# Patient Record
Sex: Male | Born: 1985 | Race: White | Hispanic: No | Marital: Married | Smoking: Never smoker
Health system: Southern US, Community
[De-identification: ages and names within clinical notes are randomized; demographics above are authoritative.]

## PROBLEM LIST (undated history)

## (undated) DIAGNOSIS — E119 Type 2 diabetes mellitus without complications: Secondary | ICD-10-CM

## (undated) DIAGNOSIS — I10 Essential (primary) hypertension: Secondary | ICD-10-CM

## (undated) DIAGNOSIS — E785 Hyperlipidemia, unspecified: Secondary | ICD-10-CM

## (undated) DIAGNOSIS — G4730 Sleep apnea, unspecified: Secondary | ICD-10-CM

## (undated) DIAGNOSIS — G43909 Migraine, unspecified, not intractable, without status migrainosus: Secondary | ICD-10-CM

## (undated) HISTORY — PX: BACK SURGERY: SHX140

---

## 2017-03-22 ENCOUNTER — Emergency Department (HOSPITAL_COMMUNITY): Payer: Worker's Compensation

## 2017-03-22 ENCOUNTER — Emergency Department (HOSPITAL_COMMUNITY)
Admission: EM | Admit: 2017-03-22 | Discharge: 2017-03-22 | Disposition: A | Discharge status: Home / Self Care | Payer: Worker's Compensation | Attending: Emergency Medicine | Admitting: Emergency Medicine

## 2017-03-22 ENCOUNTER — Encounter (HOSPITAL_COMMUNITY): Payer: Self-pay

## 2017-03-22 DIAGNOSIS — S9031XA Contusion of right foot, initial encounter: Secondary | ICD-10-CM | POA: Diagnosis not present

## 2017-03-22 DIAGNOSIS — Y990 Civilian activity done for income or pay: Secondary | ICD-10-CM | POA: Diagnosis not present

## 2017-03-22 DIAGNOSIS — Y929 Unspecified place or not applicable: Secondary | ICD-10-CM | POA: Diagnosis not present

## 2017-03-22 DIAGNOSIS — W228XXA Striking against or struck by other objects, initial encounter: Secondary | ICD-10-CM | POA: Insufficient documentation

## 2017-03-22 DIAGNOSIS — Y939 Activity, unspecified: Secondary | ICD-10-CM | POA: Diagnosis not present

## 2017-03-22 DIAGNOSIS — S99921A Unspecified injury of right foot, initial encounter: Secondary | ICD-10-CM | POA: Diagnosis present

## 2017-03-22 MED ORDER — IBUPROFEN 800 MG PO TABS
800.0000 mg | ORAL_TABLET | Freq: Once | ORAL | Status: AC
  Administered 2017-03-22: 800 mg via ORAL
  Filled 2017-03-22: qty 1

## 2017-03-22 NOTE — ED Provider Notes (Signed)
[facility]-EMERGENCY DEPT Provider Note   CSN: [id number] Arrival date & time: 03/22/17  1426  By signing my name below, I, Deland Pretty, attest that this documentation has been prepared under the direction and in the presence of [address] [place] Electronically Signed: Deland Pretty, ED Scribe. 03/22/17. 4:39 PM.  History   Chief Complaint Chief Complaint  Patient presents with  . Foot Problem   The history is provided by the patient and medical records. No language interpreter was used.  Foot Injury   The incident occurred 3 to 5 hours ago. The incident occurred at work. The injury mechanism was a direct blow. The pain is present in the right foot. The pain is at a severity of 5/10. The pain is moderate. The pain has been constant since onset. Associated symptoms include tingling. Pertinent negatives include no numbness, no loss of motion, no muscle weakness and no loss of sensation. He reports no foreign bodies present. The symptoms are aggravated by bearing weight and activity. He has tried nothing for the symptoms. The treatment provided no relief.    HPI Comments: Ricky Webb is a 31 y.o. male who presents to the Emergency Department complaining of R foot injury sustained at ~12:45pm (~4hrs prior to eval) when a heavy metal "bottle jack" fell out of a box and landed on his R foot at work. He describes his pain as 5/10 sharp constant nonradiating right dorsal foot pain that worsens with pressure and ambulation, and with no tx tried PTA. He reports associating tingling in the area where the metal object fell. The pt denies swelling, bruising, abrasions/lacerations, numbness, focal weakness, or any other complaints/additional injuries sustained at this time.    No past medical history on file.  There are no active problems to display for this patient.   No past surgical history on file.     Home Medications    Prior to Admission medications   Not on File    Family  History No family history on file.  Social History Social History  Substance Use Topics  . Smoking status: Never Smoker  . Smokeless tobacco: Never Used  . Alcohol use No     Allergies   Patient has no allergy information on record.   Review of Systems Review of Systems  Musculoskeletal: Positive for arthralgias. Negative for joint swelling and myalgias.  Skin: Negative for color change and wound.  Allergic/Immunologic: Negative for immunocompromised state.  Neurological: Positive for tingling. Negative for weakness and numbness.       +tingling R foot  Psychiatric/Behavioral: Negative for confusion.     Physical Exam Updated Vital Signs BP 131/82 (BP Location: Left Arm)   Pulse 97   Temp 97.9 F (36.6 C) (Oral)   Resp 18   Ht 5\' 11"  (1.803 m)   Wt 260 lb (117.9 kg)   SpO2 99%   BMI 36.26 kg/m   Physical Exam  Constitutional: He is oriented to person, place, and time. Vital signs are normal. He appears well-developed and well-nourished.  Non-toxic appearance. No distress.  Afebrile, nontoxic, NAD  HENT:  Head: Normocephalic and atraumatic.  Mouth/Throat: Mucous membranes are normal.  Eyes: Conjunctivae and EOM are normal. Right eye exhibits no discharge. Left eye exhibits no discharge.  Neck: Normal range of motion. Neck supple.  Cardiovascular: Normal rate and intact distal pulses.   Pulmonary/Chest: Effort normal. No respiratory distress.  Abdominal: Normal appearance. He exhibits no distension.  Musculoskeletal: Normal range of motion.  Right foot: There is tenderness, bony tenderness and swelling. There is normal range of motion, normal capillary refill, no crepitus, no deformity and no laceration.  Right foot and ankle with FROM intact, with mild swelling and bruising to the dorsal aspect of the 2nd-4th metatarsals, with mild TTP over this area, skin intact with no abrasions, no crepitus or deformity, Strength and sensation grossly intact, distal pulses  intact, compartments soft.  Neurological: He is alert and oriented to person, place, and time. He has normal strength. No sensory deficit.  Skin: Skin is warm, dry and intact. No rash noted.  Psychiatric: He has a normal mood and affect.  Nursing note and vitals reviewed.    ED Treatments / Results   DIAGNOSTIC STUDIES: Oxygen Saturation is 99% on RA, normal by my interpretation.   COORDINATION OF CARE: 4:25 PM-Discussed next steps with pt. Pt verbalized understanding and is agreeable with the plan.    Labs (all labs ordered are listed, but only abnormal results are displayed) Labs Reviewed - No data to display  EKG  EKG Interpretation None       Radiology Dg Foot Complete Right  Result Date: 03/22/2017 CLINICAL DATA:  Right foot injury today, pain to dorsum of right foot. EXAM: RIGHT FOOT COMPLETE - 3+ VIEW COMPARISON:  None. FINDINGS: There is no evidence of fracture or dislocation. There is no evidence of arthropathy or other focal bone abnormality. Soft tissues are unremarkable. IMPRESSION: Negative. Electronically Signed   By: Bary RichardStan  Maynard M.D.   On: 03/22/2017 16:59    Procedures Procedures (including critical care time)  Medications Ordered in ED Medications  ibuprofen (ADVIL,MOTRIN) tablet 800 mg (800 mg Oral Given 03/22/17 1707)     Initial Impression / Assessment and Plan / ED Course  I have reviewed the triage vital signs and the nursing notes.  Pertinent labs & imaging results that were available during my care of the patient were reviewed by me and considered in my medical decision making (see chart for details).     31 y.o. male here with R foot injury at work, heavy metal object fell on dorsal aspect of foot. Mild swelling, bruising, and tenderness to dorsal aspect particularly over 2nd-4th metatarsals. Wiggles all digits, NVI with soft compartments. Will obtain xray and give ibuprofen, then recheck shortly.   5:28 PM Xray negative, however will  still treat conservatively as a possible occult fx since foot xrays can sometimes be unreliable; doubt need for further emergent work up, but will just treat conservatively. Will place in CAM walker and give crutches for comfort. Advised RICE, tylenol/motrin for pain, and f/up with podiatry in 5-7 days for recheck of symptoms and ongoing management of his injury. I explained the diagnosis and have given explicit precautions to return to the ER including for any other new or worsening symptoms. The patient understands and accepts the medical plan as it's been dictated and I have answered their questions. Discharge instructions concerning home care and prescriptions have been given. The patient is STABLE and is discharged to home in good condition.   I personally performed the services described in this documentation, which was scribed in my presence. The recorded information has been reviewed and is accurate.    Final Clinical Impressions(s) / ED Diagnoses   Final diagnoses:  Contusion of right foot, initial encounter  Injury of right foot, initial encounter    New Prescriptions New Prescriptions   No medications on [address]file     Marlaya Turck, BryanMercedes, [place] JerseyPA-C  03/22/17 1733    Doug Sou, MD 03/23/17 2536

## 2017-03-22 NOTE — Discharge Instructions (Signed)
Wear CAM walker until you see the orthopedist. Use crutches as needed for comfort. Ice and elevate foot  throughout the day, using ice pack for no more than 20 minutes every hour.  Alternate between tylenol and motrin as needed for pain relief. Call the podiatrist follow up today or tomorrow to schedule followup appointment for recheck of ongoing foot pain in 5-7 days for recheck of symptoms and ongoing management of your injury. Return to the ER for changes or worsening symptoms.

## 2017-03-22 NOTE — ED Notes (Signed)
Bed: [facility] Expected date:  Expected time:  Means of arrival:  Comments: 

## 2017-03-22 NOTE — ED Triage Notes (Signed)
He states a bottle jack fell onto his right foot at work today.

## 2017-03-28 ENCOUNTER — Encounter: Payer: Self-pay | Admitting: Podiatry

## 2017-03-28 ENCOUNTER — Ambulatory Visit (INDEPENDENT_AMBULATORY_CARE_PROVIDER_SITE_OTHER): Payer: Worker's Compensation | Admitting: Podiatry

## 2017-03-28 DIAGNOSIS — S9031XA Contusion of right foot, initial encounter: Secondary | ICD-10-CM

## 2017-03-28 MED ORDER — DICLOFENAC SODIUM 75 MG PO TBEC: 75.0000 mg | DELAYED_RELEASE_TABLET | Freq: Two times a day (BID) | ORAL | 1 refills | Status: DC

## 2017-03-29 NOTE — Progress Notes (Signed)
   HPI: Patient is a 31 year old male presenting today with a complaint of sharp, shooting pain and tightness to the dorsum of the right foot that began 6 days ago. He reports dropping a 50 pound piece of steel on the foot. He was seen in the ED and given a cam boot. He reports significant pain when attempting to walk without the boot. Walking and applying pressure to the foot increases the pain. He has been wearing the boot and using crutches as well as taking Tylenol and Motrin with no significant relief of the pain. He is here for further evaluation and treatment.   Physical Exam: General: The patient is alert and oriented x3 in no acute distress.  Dermatology: Skin is warm, dry and supple bilateral lower extremities. Negative for open lesions or macerations.  Vascular: Palpable pedal pulses bilaterally. No edema or erythema noted. Capillary refill within normal limits.  Neurological: Epicritic and protective threshold grossly intact bilaterally.   Musculoskeletal Exam: Moderate edema with pain on palpation throughout dorsum of right foot. Range of motion within normal limits to all pedal and ankle joints bilateral. Muscle strength 5/5 in all groups bilateral.    Assessment: 1. Right foot contusion secondary to trauma   Plan of Care:  1. Patient was evaluated. X-rays from ED reviewed. 2. Continue wearing cam boot. 3. Ace wraps dispensed. 4. Note for work provided. 5. Prescription for diclofenac 75 mg given to patient. 6. Return to clinic in 3 weeks.  Patient works at [employer].   Felecia ShellingBrent M. Evans, DPM [facility]  Dr. Felecia ShellingBrent M. Evans, DPM    [address]                                        [place], [place] [address]                Office [phone]  Fax [phone]

## 2017-04-18 ENCOUNTER — Encounter: Payer: Self-pay | Admitting: Podiatry

## 2017-04-18 ENCOUNTER — Ambulatory Visit (INDEPENDENT_AMBULATORY_CARE_PROVIDER_SITE_OTHER): Payer: Worker's Compensation | Admitting: Podiatry

## 2017-04-18 DIAGNOSIS — S9031XD Contusion of right foot, subsequent encounter: Secondary | ICD-10-CM

## 2017-04-18 MED ORDER — DICLOFENAC SODIUM 75 MG PO TBEC: 75.0000 mg | DELAYED_RELEASE_TABLET | Freq: Two times a day (BID) | ORAL | 1 refills | Status: DC

## 2017-04-22 NOTE — Progress Notes (Signed)
   HPI: Patient is a 31 year old male presenting today for follow-up evaluation of right foot pain secondary to trauma. He states the pain has improved but he is still experiencing tenderness to the plantar aspect of the foot. He reports associated improving swelling. He has been taking diclofenac with some relief of pain. He denies any new complaints at this time.    Physical Exam: General: The patient is alert and oriented x3 in no acute distress.  Dermatology: Skin is warm, dry and supple bilateral lower extremities. Negative for open lesions or macerations.  Vascular: Palpable pedal pulses bilaterally. No edema or erythema noted. Capillary refill within normal limits.  Neurological: Epicritic and protective threshold grossly intact bilaterally.   Musculoskeletal Exam: Minimal pain on palpation to right midfoot. Range of motion within normal limits to all pedal and ankle joints bilateral. Muscle strength 5/5 in all groups bilateral.    Assessment: 1. Right foot contusion secondary to trauma   Plan of Care:  1. Patient was evaluated.  2. Refill prescription for diclofenac 75 mg #60 given to patient. 3. Compression anklet dispensed. 4. Return to clinic when necessary.  Patient works at [employer].   Felecia ShellingBrent M. Xareni Kelch, DPM [facility]  Dr. Felecia ShellingBrent M. Apollo Timothy, DPM    [address]                                        [place], [place] [address]                Office [phone]  Fax [phone]

## 2017-07-06 ENCOUNTER — Emergency Department
Admission: EM | Admit: 2017-07-06 | Discharge: 2017-07-06 | Disposition: A | Discharge status: Home / Self Care | Payer: Self-pay | Attending: Emergency Medicine | Admitting: Emergency Medicine

## 2017-07-06 ENCOUNTER — Emergency Department: Payer: Self-pay

## 2017-07-06 ENCOUNTER — Encounter: Payer: Self-pay | Admitting: Emergency Medicine

## 2017-07-06 DIAGNOSIS — I10 Essential (primary) hypertension: Secondary | ICD-10-CM | POA: Insufficient documentation

## 2017-07-06 DIAGNOSIS — Z79899 Other long term (current) drug therapy: Secondary | ICD-10-CM | POA: Insufficient documentation

## 2017-07-06 DIAGNOSIS — J209 Acute bronchitis, unspecified: Secondary | ICD-10-CM | POA: Insufficient documentation

## 2017-07-06 DIAGNOSIS — J029 Acute pharyngitis, unspecified: Secondary | ICD-10-CM | POA: Insufficient documentation

## 2017-07-06 HISTORY — DX: Essential (primary) hypertension: I10

## 2017-07-06 MED ORDER — BENZONATATE 100 MG PO CAPS: 200.0000 mg | ORAL_CAPSULE | Freq: Three times a day (TID) | ORAL | 0 refills | Status: DC | PRN

## 2017-07-06 MED ORDER — SULFAMETHOXAZOLE-TRIMETHOPRIM 800-160 MG PO TABS: 1.0000 | ORAL_TABLET | Freq: Two times a day (BID) | ORAL | 0 refills | Status: DC

## 2017-07-06 MED ORDER — IPRATROPIUM-ALBUTEROL 0.5-2.5 (3) MG/3ML IN SOLN
3.0000 mL | Freq: Once | RESPIRATORY_TRACT | Status: AC
  Administered 2017-07-06: 3 mL via RESPIRATORY_TRACT
  Filled 2017-07-06: qty 3

## 2017-07-06 MED ORDER — PREDNISONE 10 MG PO TABS: ORAL_TABLET | ORAL | 0 refills | Status: DC

## 2017-07-06 NOTE — ED Triage Notes (Signed)
Pt arrived via pov with reports of runny nose, sore throat for a week, cough that is productive and hoarse voice.  Pt has been taking nyquil and benadryl OTC as well as mucinex.   Pt denies any fevers.

## 2017-07-06 NOTE — ED Provider Notes (Signed)
[facility] Emergency Department Provider Note   ____________________________________________   First MD Initiated Contact with Patient 07/06/17 1224     (approximate)  I have reviewed the triage vital signs and the nursing notes.   HISTORY  Chief Complaint Cough; Nasal Congestion; and Sore Throat   HPI Ricky Webb is a 31 y.o. male is here complaining of rhinorrhea sore throat and cough for one week. Patient states that cough has been productive and he is also hoarse. He has been taking over-the-counter medications including NyQuil, Benadryl, Mucinex without any relief. Patient denies any fever or chills. He continues to eat and drink normally. Patient states that he occasionally vaps but is not a smoker. He rates his pain as 6/10.   Past Medical History:  Diagnosis Date  . Hypertension     There are no active problems to display for this patient.   Past Surgical History:  Procedure Laterality Date  . BACK SURGERY      Prior to Admission medications   Medication Sig Start Date End Date Taking? Authorizing Provider  benzonatate (TESSALON PERLES) 100 MG capsule Take 2 capsules (200 mg total) by mouth 3 (three) times daily as needed. 07/06/17 07/06/18  Tommi RumpsSummers, Francisco Ostrovsky L, PA-C  diclofenac (VOLTAREN) 75 MG EC tablet Take 1 tablet (75 mg total) by mouth 2 (two) times daily. 04/18/17   Felecia ShellingEvans, Brent M, DPM  predniSONE (DELTASONE) 10 MG tablet Take 6 tablets  today, on day 2 take 5 tablets, day 3 take 4 tablets, day 4 take 3 tablets, day 5 take  2 tablets and 1 tablet the last day 07/06/17   Tommi RumpsSummers, Carmello Cabiness L, PA-C  sulfamethoxazole-trimethoprim (BACTRIM DS,SEPTRA DS) 800-160 MG tablet Take 1 tablet by mouth 2 (two) times daily. 07/06/17   Tommi RumpsSummers, Valborg Friar L, PA-C    Allergies Penicillins  No family history on file.  Social History Social History  Substance Use Topics  . Smoking status: Never Smoker  . Smokeless tobacco: Never Used  . Alcohol use No     Review of Systems Constitutional: No fever/chills Eyes: No visual changes. ENT: Positive sore throat and hoarseness. Positive rhinorrhea. Cardiovascular: Denies chest pain. Respiratory: Denies shortness of breath. Positive productive cough. Gastrointestinal:  No nausea, no vomiting.  No diarrhea.  Skin: Negative for rash. Neurological: Negative for headaches, focal weakness or numbness. ____________________________________________   PHYSICAL EXAM:  VITAL SIGNS: ED Triage Vitals  Enc Vitals Group     BP 07/06/17 1201 130/84     Pulse Rate 07/06/17 1201 93     Resp --      Temp 07/06/17 1201 98 F (36.7 C)     Temp Source 07/06/17 1201 Oral     SpO2 07/06/17 1201 94 %     Weight 07/06/17 1201 250 lb (113.4 kg)     Height 07/06/17 1201 5\' 11"  (1.803 m)     Head Circumference --      Peak Flow --      Pain Score 07/06/17 1207 6     Pain Loc --      Pain Edu? --      Excl. in GC? --    Constitutional: Alert and oriented. Well appearing and in no acute distress. Eyes: Conjunctivae are normal.  Head: Atraumatic. Nose: Moderate congestion/rhinnorhea. Mouth/Throat: Mucous membranes are moist.  Oropharynx non-erythematous. Moderate posterior drainage noted. Neck: No stridor.   Hematological/Lymphatic/Immunilogical: No cervical lymphadenopathy. Cardiovascular: Normal rate, regular rhythm. Grossly normal heart sounds.  Good peripheral circulation.  Respiratory: Normal respiratory effort.  No retractions. Lungs occasional expiratory wheeze heard. Clears with cough. Musculoskeletal: No lower extremity tenderness nor edema.  No joint effusions. Neurologic:  Normal speech and language. No gross focal neurologic deficits are appreciated.  Skin:  Skin is warm, dry and intact. No rash noted. Psychiatric: Mood and affect are normal. Speech and behavior are normal.  ____________________________________________   LABS (all labs ordered are listed, but only abnormal results are  displayed)  Labs Reviewed - No data to display  RADIOLOGY  Dg Chest 2 View  Result Date: 07/06/2017 CLINICAL DATA:  Runny nose, sore throat for a week, worsening cough that is productive and hoarse voice. Pt denies any fevers. Hx - HTN, occasionally vapes. EXAM: CHEST  2 VIEW COMPARISON:  None. FINDINGS: Heart size is normal. Lungs are clear. No pleural effusion or pneumothorax seen. Old healed rib fractures on the right. No acute or suspicious osseous finding. IMPRESSION: No active cardiopulmonary disease. No evidence of pneumonia or pulmonary edema. Old healed rib fractures on the right. Electronically Signed   By: Bary Richard M.D.   On: 07/06/2017 13:18    ____________________________________________   PROCEDURES  Procedure(s) performed: None  Procedures  Critical Care performed: No  ____________________________________________   INITIAL IMPRESSION / ASSESSMENT AND PLAN / ED COURSE  Pertinent labs & imaging results that were available during my care of the patient were reviewed by me and considered in my medical decision making (see chart for details).  ----------------------------------------- 12:59 PM on 07/06/2017 ----------------------------------------- Patient had DuoNeb treatment and continues to wheeze faintly bilaterally. Continued cough but nonproductive.   Patient's chest x-ray was reassuring that there is no pneumonia present. Patient was discharged with a prescription for Tessalon Perles to every 8 hours as needed for cough, Bactrim DS twice a day for 10 days and a tapering dose of prednisone starting at 60 mg. He is follow-up with [facility] clinic if any continued problems.   ___________________________________________   FINAL CLINICAL IMPRESSION(S) / ED DIAGNOSES  Final diagnoses:  Acute bronchitis, unspecified organism      NEW MEDICATIONS STARTED DURING THIS VISIT:  Discharge Medication List as of 07/06/2017  1:28 PM    START taking these  medications   Details  benzonatate (TESSALON PERLES) 100 MG capsule Take 2 capsules (200 mg total) by mouth 3 (three) times daily as needed., Starting Sun 07/06/2017, Until Mon 07/06/2018, Print    predniSONE (DELTASONE) 10 MG tablet Take 6 tablets  today, on day 2 take 5 tablets, day 3 take 4 tablets, day 4 take 3 tablets, day 5 take  2 tablets and 1 tablet the last day, Print    sulfamethoxazole-trimethoprim (BACTRIM DS,SEPTRA DS) 800-160 MG tablet Take 1 tablet by mouth 2 (two) times daily., Starting Sun 07/06/2017, Print         Note:  This document was prepared using Dragon voice recognition software and may include unintentional dictation errors.    Tommi Rumps, PA-C 07/06/17 1337    Dionne Bucy, MD 07/06/17 1530

## 2017-07-06 NOTE — Discharge Instructions (Signed)
Follow-up with [facility] if any continued problems. Begin taking prednisone as directed begin with 6 tablets today and tapering down. Tessalon Perles as needed for cough and begin Bactrim DS which is the antibiotic one tablet twice a day for 10 days. You may still take Tylenol or ibuprofen as needed for fever or body aches.

## 2018-01-05 ENCOUNTER — Other Ambulatory Visit: Payer: Self-pay

## 2018-01-05 ENCOUNTER — Emergency Department
Admission: EM | Admit: 2018-01-05 | Discharge: 2018-01-05 | Disposition: A | Discharge status: Home / Self Care | Payer: Medicaid Other | Attending: Emergency Medicine | Admitting: Emergency Medicine

## 2018-01-05 DIAGNOSIS — B9789 Other viral agents as the cause of diseases classified elsewhere: Secondary | ICD-10-CM

## 2018-01-05 DIAGNOSIS — F17290 Nicotine dependence, other tobacco product, uncomplicated: Secondary | ICD-10-CM | POA: Diagnosis not present

## 2018-01-05 DIAGNOSIS — J069 Acute upper respiratory infection, unspecified: Secondary | ICD-10-CM

## 2018-01-05 DIAGNOSIS — I10 Essential (primary) hypertension: Secondary | ICD-10-CM | POA: Diagnosis not present

## 2018-01-05 DIAGNOSIS — R05 Cough: Secondary | ICD-10-CM | POA: Diagnosis present

## 2018-01-05 MED ORDER — GUAIFENESIN-CODEINE 100-10 MG/5ML PO SOLN: 5.0000 mL | ORAL | 0 refills | Status: DC | PRN

## 2018-01-05 NOTE — Discharge Instructions (Signed)
Follow-up with [facility] acute care if any continued problems.  Begin taking Tylenol or ibuprofen if needed for fever or body aches.  Increase fluids.  Robitussin-AC is for cough and congestion.  Do not take this medication and drive or operate machinery as this medication contains a narcotic.

## 2018-01-05 NOTE — ED Provider Notes (Signed)
[facility] Emergency Department Provider Note   ____________________________________________   First MD Initiated Contact with Patient 01/05/18 0902     (approximate)  I have reviewed the triage vital signs and the nursing notes.   HISTORY  Chief Complaint URI   HPI Ricky Webb is a 32 y.o. male here with complaint of cough for the last 3-4 days.  Patient denies any fever.  Patient does not smoke but occasionally vapes.  He states he has a small child at home with similar symptoms.  He denies any body aches, nausea, vomiting or diarrhea.  Past Medical History:  Diagnosis Date  . Hypertension     There are no active problems to display for this patient.   Past Surgical History:  Procedure Laterality Date  . BACK SURGERY      Prior to Admission medications   Medication Sig Start Date End Date Taking? Authorizing Provider  diclofenac (VOLTAREN) 75 MG EC tablet Take 1 tablet (75 mg total) by mouth 2 (two) times daily. 04/18/17   Felecia ShellingEvans, Brent M, DPM  guaiFENesin-codeine 100-10 MG/5ML syrup Take 5 mLs by mouth every 4 (four) hours as needed. 01/05/18   Tommi RumpsSummers, Espiridion Supinski L, PA-C    Allergies Penicillins  No family history on file.  Social History Social History   Tobacco Use  . Smoking status: Never Smoker  . Smokeless tobacco: Never Used  Substance Use Topics  . Alcohol use: No  . Drug use: No    Review of Systems Constitutional: No fever/chills Eyes: No visual changes. ENT: No sore throat.  Negative for ear pain. Cardiovascular: Denies chest pain. Respiratory: Denies shortness of breath.  Positive for nonproductive cough. Gastrointestinal: No abdominal pain.  No nausea, no vomiting.  No diarrhea.   Musculoskeletal: Negative for back pain. Skin: Negative for rash. Neurological: Negative for headaches, focal weakness or numbness. ____________________________________________   PHYSICAL EXAM:  VITAL SIGNS: ED Triage Vitals [01/05/18  0844]  Enc Vitals Group     BP 139/88     Pulse Rate 95     Resp 18     Temp 98.1 F (36.7 C)     Temp Source Oral     SpO2 95 %     Weight      Height      Head Circumference      Peak Flow      Pain Score      Pain Loc      Pain Edu?      Excl. in GC?    Constitutional: Alert and oriented. Well appearing and in no acute distress. Eyes: Conjunctivae are normal.  Head: Atraumatic. Nose: Mild congestion/rhinnorhea.  TMs are clear bilaterally Mouth/Throat: Mucous membranes are moist.  Oropharynx non-erythematous.  Uvula is midline. Neck: No stridor.   Hematological/Lymphatic/Immunilogical: No cervical lymphadenopathy. Cardiovascular: Normal rate, regular rhythm. Grossly normal heart sounds.  Good peripheral circulation. Respiratory: Normal respiratory effort.  No retractions. Lungs CTAB.  Frequent nonproductive cough is heard.  No wheezes or rales noted. Gastrointestinal: Soft and nontender. No distention. Musculoskeletal: Moves upper and lower extremities without any difficulty and normal gait was noted. Neurologic:  Normal speech and language. No gross focal neurologic deficits are appreciated.  Skin:  Skin is warm, dry and intact. No rash noted. Psychiatric: Mood and affect are normal. Speech and behavior are normal.  ____________________________________________   LABS (all labs ordered are listed, but only abnormal results are displayed)  Labs Reviewed - No data to display  PROCEDURES  Procedure(s) performed: None  Procedures  Critical Care performed: No  ____________________________________________   INITIAL IMPRESSION / ASSESSMENT AND PLAN / ED COURSE Patient was given prescription for Robitussin-AC as needed for cough.  Most likely this is a viral URI and patient was made aware.  He also has young children at home with same symptoms.  He is encouraged to drink fluids and take Tylenol or ibuprofen if needed.  He is to follow-up with [facility] acute care  since he does not have a PCP.  ____________________________________________   FINAL CLINICAL IMPRESSION(S) / ED DIAGNOSES  Final diagnoses:  Viral URI with cough     ED Discharge Orders        Ordered    guaiFENesin-codeine 100-10 MG/5ML syrup  Every 4 hours PRN     01/05/18 0917       Note:  This document was prepared using Dragon voice recognition software and may include unintentional dictation errors.    Tommi Rumps, PA-C 01/05/18 1017    Emily Filbert, MD 01/05/18 1032

## 2018-01-05 NOTE — ED Notes (Signed)
See triage note  Presents with cough ,sore throat and sinus pressure    sxs' started couple of days ago  Non prod cough noted on arrival   Afebrile on arrival

## 2018-01-05 NOTE — ED Triage Notes (Signed)
Pt c/o sinus and chest congestion for the past couple of days..Marland Kitchen

## 2018-12-21 ENCOUNTER — Encounter: Payer: Self-pay | Admitting: Emergency Medicine

## 2018-12-21 ENCOUNTER — Emergency Department: Payer: Self-pay

## 2018-12-21 ENCOUNTER — Emergency Department
Admission: EM | Admit: 2018-12-21 | Discharge: 2018-12-21 | Disposition: A | Discharge status: Home / Self Care | Payer: Self-pay | Attending: Emergency Medicine | Admitting: Emergency Medicine

## 2018-12-21 DIAGNOSIS — I10 Essential (primary) hypertension: Secondary | ICD-10-CM | POA: Insufficient documentation

## 2018-12-21 DIAGNOSIS — M722 Plantar fascial fibromatosis: Secondary | ICD-10-CM | POA: Insufficient documentation

## 2018-12-21 MED ORDER — MELOXICAM 15 MG PO TABS: 15.0000 mg | ORAL_TABLET | Freq: Every day | ORAL | 2 refills | Status: AC

## 2018-12-21 NOTE — ED Triage Notes (Signed)
Pt reports pain to left foot and ankle since Saturday. Denies injuries but states that it hurts to put pressure on it.

## 2018-12-21 NOTE — Discharge Instructions (Addendum)
Follow-up with Dr. Alberteen Spindle if not better in 5 to 7 days.  Use medication as prescribed.  Apply ice to the foot.  Buy a heel lift for your shoes.

## 2018-12-21 NOTE — ED Notes (Signed)
See triage note  Presents with pain to left foot  Pain started on Saturday  Pain is described as pressure  And is increased with ambulation  Ambulates with slight limp d/t pain

## 2018-12-21 NOTE — ED Provider Notes (Signed)
[facility] Emergency Department Provider Note  ____________________________________________   First MD Initiated Contact with Patient 12/21/18 1236     (approximate)  I have reviewed the triage vital signs and the nursing notes.   HISTORY  Chief Complaint Ankle Pain and Foot Pain    HPI Ricky Webb is a 33 y.o. male presents emergency department complaint of left foot pain.  States pain is worse with bearing weight.  He denies any known injury.  No history of gout.    Past Medical History:  Diagnosis Date  . Hypertension     There are no active problems to display for this patient.   Past Surgical History:  Procedure Laterality Date  . BACK SURGERY      Prior to Admission medications   Medication Sig Start Date End Date Taking? Authorizing Provider  meloxicam (MOBIC) 15 MG tablet Take 1 tablet (15 mg total) by mouth daily. 12/21/18 12/21/19  Faythe Ghee, PA-C    Allergies Penicillins  No family history on file.  Social History Social History   Tobacco Use  . Smoking status: Never Smoker  . Smokeless tobacco: Never Used  Substance Use Topics  . Alcohol use: No  . Drug use: No    Review of Systems  Constitutional: No fever/chills Eyes: No visual changes. ENT: No sore throat. Respiratory: Denies cough Genitourinary: Negative for dysuria. Musculoskeletal: Negative for back pain.  Positive left foot pain Skin: Negative for rash.    ____________________________________________   PHYSICAL EXAM:  VITAL SIGNS: ED Triage Vitals  Enc Vitals Group     BP 12/21/18 1131 (!) 146/84     Pulse Rate 12/21/18 1131 94     Resp 12/21/18 1131 20     Temp 12/21/18 1131 98.2 F (36.8 C)     Temp Source 12/21/18 1131 Oral     SpO2 12/21/18 1131 96 %     Weight 12/21/18 1124 290 lb (131.5 kg)     Height 12/21/18 1124 5\' 11"  (1.803 m)     Head Circumference --      Peak Flow --      Pain Score 12/21/18 1123 9     Pain Loc --     Pain Edu? --      Excl. in GC? --     Constitutional: Alert and oriented. Well appearing and in no acute distress. Eyes: Conjunctivae are normal.  Head: Atraumatic. Nose: No congestion/rhinnorhea. Mouth/Throat: Mucous membranes are moist.   Neck:  supple no lymphadenopathy noted Cardiovascular: Normal rate, regular rhythm. Heart sounds are normal Respiratory: Normal respiratory effort.  No retractions, lungs c t a  GU: deferred Musculoskeletal: FROM all extremities, warm and well perfused, plantar tender is tender to palpation, fifth metatarsal is tender.  Neurovascular is intact. Neurologic:  Normal speech and language.  Skin:  Skin is warm, dry and intact. No rash noted. Psychiatric: Mood and affect are normal. Speech and behavior are normal.  ____________________________________________   LABS (all labs ordered are listed, but only abnormal results are displayed)  Labs Reviewed - No data to display ____________________________________________   ____________________________________________  RADIOLOGY  xray left foot is negative except for calcaneal spur  ____________________________________________   PROCEDURES  Procedure(s) performed: No  Procedures    ____________________________________________   INITIAL IMPRESSION / ASSESSMENT AND PLAN / ED COURSE  Pertinent labs & imaging results that were available during my care of the patient were reviewed by me and considered in my medical decision making (  see chart for details).   Patient is 33 year old male presents emergency department complaint of left foot pain.  Physical exam shows plantar tendon  to be tender.  X-ray of the left foot is negative  Explained all findings with patient.  Explained to him he has plantar fasciitis.  Several recommendations which included ice, stretching, and orthotics.  He is to buy shoes that have better support.  Follow-up with Dr. Alberteen Spindle if not better in 1 to 2 weeks.  He states  he understands will comply.  Is discharged stable condition.     As part of my medical decision making, I reviewed the following data within the electronic MEDICAL RECORD NUMBER Nursing notes reviewed and incorporated, Old chart reviewed, Notes from prior ED visits and [place] Controlled Substance Database  ____________________________________________   FINAL CLINICAL IMPRESSION(S) / ED DIAGNOSES  Final diagnoses:  Plantar fasciitis      NEW MEDICATIONS STARTED DURING THIS VISIT:  Discharge Medication List as of 12/21/2018  2:25 PM    START taking these medications   Details  meloxicam (MOBIC) 15 MG tablet Take 1 tablet (15 mg total) by mouth daily., Starting Mon 12/21/2018, Until Tue 12/21/2019, Normal         Note:  This document was prepared using Dragon voice recognition software and may include unintentional dictation errors.    Faythe Ghee, PA-C 12/21/18 1556    Rockne Menghini, MD 12/21/18 [phone]

## 2019-02-23 ENCOUNTER — Encounter: Payer: Self-pay | Admitting: Emergency Medicine

## 2019-02-23 ENCOUNTER — Other Ambulatory Visit: Payer: Self-pay

## 2019-02-23 ENCOUNTER — Emergency Department
Admission: EM | Admit: 2019-02-23 | Discharge: 2019-02-23 | Disposition: A | Discharge status: Home / Self Care | Payer: HRSA Program | Attending: Emergency Medicine | Admitting: Emergency Medicine

## 2019-02-23 DIAGNOSIS — Z20828 Contact with and (suspected) exposure to other viral communicable diseases: Secondary | ICD-10-CM | POA: Diagnosis not present

## 2019-02-23 DIAGNOSIS — B349 Viral infection, unspecified: Secondary | ICD-10-CM | POA: Insufficient documentation

## 2019-02-23 DIAGNOSIS — I10 Essential (primary) hypertension: Secondary | ICD-10-CM | POA: Insufficient documentation

## 2019-02-23 DIAGNOSIS — R51 Headache: Secondary | ICD-10-CM | POA: Diagnosis present

## 2019-02-23 DIAGNOSIS — R519 Headache, unspecified: Secondary | ICD-10-CM

## 2019-02-23 LAB — GLUCOSE, CAPILLARY: Glucose-Capillary: 127 mg/dL — ABNORMAL HIGH (ref 70–99)

## 2019-02-23 MED ORDER — BUTALBITAL-APAP-CAFFEINE 50-325-40 MG PO TABS: 1.0000 | ORAL_TABLET | Freq: Four times a day (QID) | ORAL | 0 refills | Status: AC | PRN

## 2019-02-23 MED ORDER — BUTALBITAL-APAP-CAFFEINE 50-325-40 MG PO TABS
2.0000 | ORAL_TABLET | Freq: Once | ORAL | Status: AC
Start: 2019-02-23 — End: 2019-02-23
  Administered 2019-02-23: 2 via ORAL
  Filled 2019-02-23: qty 2

## 2019-02-23 MED ORDER — HYDROCOD POLST-CPM POLST ER 10-8 MG/5ML PO SUER: 5.0000 mL | Freq: Two times a day (BID) | ORAL | 0 refills | Status: AC

## 2019-02-23 NOTE — ED Triage Notes (Signed)
Says fever , cough 2 days and migraine

## 2019-02-23 NOTE — ED Notes (Signed)
EDP at bedside  

## 2019-02-23 NOTE — ED Provider Notes (Signed)
[facility] Emergency Department Provider Note       Time seen: ----------------------------------------- 12:42 PM on 02/23/2019 -----------------------------------------   I have reviewed the triage vital signs and the nursing notes.  HISTORY   Chief Complaint Fever; Cough; and Migraine    HPI Ricky Webb is a 33 y.o. male with a history of hypertension who presents to the ED for fever, cough for the last 2 days and a migraine headache.  Currently pain is 8 out of 10.  He denies any vomiting or diarrhea, typically states he has headaches several days a week and this 1 seems more intense than normal.  Past Medical History:  Diagnosis Date  . Hypertension     There are no active problems to display for this patient.   Past Surgical History:  Procedure Laterality Date  . BACK SURGERY      Allergies Penicillins  Social History Social History   Tobacco Use  . Smoking status: Never Smoker  . Smokeless tobacco: Never Used  Substance Use Topics  . Alcohol use: No  . Drug use: No    Review of Systems Constitutional: Positive for fever Cardiovascular: Negative for chest pain. Respiratory: Negative for shortness of breath.  Positive for cough Gastrointestinal: Negative for abdominal pain, vomiting and diarrhea. Musculoskeletal: Negative for back pain. Skin: Negative for rash. Neurological: Positive for headache  All systems negative/normal/unremarkable except as stated in the HPI  ____________________________________________   PHYSICAL EXAM:  VITAL SIGNS: ED Triage Vitals  Enc Vitals Group     BP 02/23/19 1231 (!) 179/117     Pulse Rate 02/23/19 1231 81     Resp 02/23/19 1231 18     Temp 02/23/19 1231 97.7 F (36.5 C)     Temp Source 02/23/19 1231 Oral     SpO2 02/23/19 1231 97 %     Weight 02/23/19 1225 (!) 390 lb (176.9 kg)     Height 02/23/19 1225 5\' 11"  (1.803 m)     Head Circumference --      Peak Flow --      Pain Score  02/23/19 1225 8     Pain Loc --      Pain Edu? --      Excl. in GC? --    Constitutional: Alert and oriented.  Morbidly obese, no distress Eyes: Conjunctivae are normal. Normal extraocular movements. ENT      Head: Normocephalic and atraumatic.      Nose: No congestion/rhinnorhea.      Mouth/Throat: Mucous membranes are moist.      Neck: No stridor. Cardiovascular: Normal rate, regular rhythm. No murmurs, rubs, or gallops. Respiratory: Normal respiratory effort without tachypnea nor retractions. Breath sounds are clear and equal bilaterally. No wheezes/rales/rhonchi. Gastrointestinal: Soft and nontender. Normal bowel sounds Musculoskeletal: Nontender with normal range of motion in extremities. No lower extremity tenderness nor edema. Neurologic:  Normal speech and language. No gross focal neurologic deficits are appreciated.  Skin:  Skin is warm, dry and intact. No rash noted. Psychiatric: Mood and affect are normal. Speech and behavior are normal.  ____________________________________________  ED COURSE:  As part of my medical decision making, I reviewed the following data within the electronic MEDICAL RECORD NUMBER History obtained from family if available, nursing notes, old chart and ekg, as well as notes from prior ED visits. Patient presented for fever, cough and migraine headache, we will assess with labs and imaging as indicated at this time.   Procedures  Ricky Webb was  evaluated in Emergency Department on 02/23/2019 for the symptoms described in the history of present illness. He was evaluated in the context of the global COVID-19 pandemic, which necessitated consideration that the patient might be at risk for infection with the SARS-CoV-2 virus that causes COVID-19. Institutional protocols and algorithms that pertain to the evaluation of patients at risk for COVID-19 are in a state of rapid change based on information released by regulatory bodies including the CDC and federal and  state organizations. These policies and algorithms were followed during the patient's care in the ED.  ____________________________________________   LABS (pertinent positives/negatives)  Labs Reviewed  NOVEL CORONAVIRUS, NAA (HOSPITAL ORDER, SEND-OUT TO REF LAB)  CBG MONITORING, ED   ____________________________________________   DIFFERENTIAL DIAGNOSIS   Viral syndrome, coronavirus, migraine headache, hyperglycemia, hypertension  FINAL ASSESSMENT AND PLAN  Viral illness, migraine headache   Plan: The patient had presented for viral symptoms and persistent headache. Patient's labs are reassuring.  We have sent a send out coronavirus test.  He is stable for outpatient follow-up, will be contacted if test results are positive.   Ulice DashJohnathan E Felecity Lemaster, MD    Note: This note was generated in part or whole with voice recognition software. Voice recognition is usually quite accurate but there are transcription errors that can and very often do occur. I apologize for any typographical errors that were not detected and corrected.     Emily FilbertWilliams, Porfiria Heinrich E, MD 02/23/19 1256

## 2019-02-24 ENCOUNTER — Telehealth: Payer: Self-pay | Admitting: Emergency Medicine

## 2019-02-24 LAB — NOVEL CORONAVIRUS, NAA (HOSP ORDER, SEND-OUT TO REF LAB; TAT 18-24 HRS): SARS-CoV-2, NAA: NOT DETECTED

## 2019-02-24 NOTE — Telephone Encounter (Addendum)
Called patient to inform of covid 19 result negative.  Left message.  pateint called me back and I gave him result

## 2019-03-01 ENCOUNTER — Emergency Department
Admission: EM | Admit: 2019-03-01 | Discharge: 2019-03-01 | Disposition: A | Discharge status: Home / Self Care | Payer: Self-pay | Attending: Emergency Medicine | Admitting: Emergency Medicine

## 2019-03-01 ENCOUNTER — Other Ambulatory Visit: Payer: Self-pay

## 2019-03-01 ENCOUNTER — Emergency Department: Payer: Self-pay

## 2019-03-01 DIAGNOSIS — I10 Essential (primary) hypertension: Secondary | ICD-10-CM | POA: Insufficient documentation

## 2019-03-01 DIAGNOSIS — K529 Noninfective gastroenteritis and colitis, unspecified: Secondary | ICD-10-CM | POA: Insufficient documentation

## 2019-03-01 DIAGNOSIS — Z20828 Contact with and (suspected) exposure to other viral communicable diseases: Secondary | ICD-10-CM | POA: Insufficient documentation

## 2019-03-01 LAB — CBC WITH DIFFERENTIAL/PLATELET
Abs Immature Granulocytes: 0.04 10*3/uL (ref 0.00–0.07)
Basophils Absolute: 0 10*3/uL (ref 0.0–0.1)
Basophils Relative: 0 %
Eosinophils Absolute: 0.2 10*3/uL (ref 0.0–0.5)
Eosinophils Relative: 2 %
HCT: 46.9 % (ref 39.0–52.0)
Hemoglobin: 15.4 g/dL (ref 13.0–17.0)
Immature Granulocytes: 0 %
Lymphocytes Relative: 23 %
Lymphs Abs: 2.2 10*3/uL (ref 0.7–4.0)
MCH: 29.9 pg (ref 26.0–34.0)
MCHC: 32.8 g/dL (ref 30.0–36.0)
MCV: 91.1 fL (ref 80.0–100.0)
Monocytes Absolute: 0.7 10*3/uL (ref 0.1–1.0)
Monocytes Relative: 7 %
Neutro Abs: 6.5 10*3/uL (ref 1.7–7.7)
Neutrophils Relative %: 68 %
Platelets: 287 10*3/uL (ref 150–400)
RBC: 5.15 MIL/uL (ref 4.22–5.81)
RDW: 14.1 % (ref 11.5–15.5)
WBC: 9.7 10*3/uL (ref 4.0–10.5)
nRBC: 0 % (ref 0.0–0.2)

## 2019-03-01 LAB — URINALYSIS, COMPLETE (UACMP) WITH MICROSCOPIC
Bacteria, UA: NONE SEEN
Bilirubin Urine: NEGATIVE
Glucose, UA: NEGATIVE mg/dL
Hgb urine dipstick: NEGATIVE
Ketones, ur: NEGATIVE mg/dL
Leukocytes,Ua: NEGATIVE
Nitrite: NEGATIVE
Protein, ur: NEGATIVE mg/dL
Specific Gravity, Urine: 1.02 (ref 1.005–1.030)
Squamous Epithelial / LPF: NONE SEEN (ref 0–5)
pH: 5 (ref 5.0–8.0)

## 2019-03-01 LAB — COMPREHENSIVE METABOLIC PANEL
ALT: 49 U/L — ABNORMAL HIGH (ref 0–44)
AST: 35 U/L (ref 15–41)
Albumin: 4.1 g/dL (ref 3.5–5.0)
Alkaline Phosphatase: 104 U/L (ref 38–126)
Anion gap: 11 (ref 5–15)
BUN: 15 mg/dL (ref 6–20)
CO2: 28 mmol/L (ref 22–32)
Calcium: 9.4 mg/dL (ref 8.9–10.3)
Chloride: 99 mmol/L (ref 98–111)
Creatinine, Ser: 0.79 mg/dL (ref 0.61–1.24)
GFR calc Af Amer: >60 mL/min (ref >60)
GFR calc non Af Amer: >60 mL/min (ref >60)
Glucose, Bld: 159 mg/dL — ABNORMAL HIGH (ref 70–99)
Potassium: 4.4 mmol/L (ref 3.5–5.1)
Sodium: 138 mmol/L (ref 135–145)
Total Bilirubin: 0.4 mg/dL (ref 0.3–1.2)
Total Protein: 7.8 g/dL (ref 6.5–8.1)

## 2019-03-01 LAB — LIPASE, BLOOD: Lipase: 27 U/L (ref 11–51)

## 2019-03-01 LAB — SARS CORONAVIRUS 2 BY RT PCR (HOSPITAL ORDER, PERFORMED IN ~~LOC~~ HOSPITAL LAB): SARS Coronavirus 2: NEGATIVE

## 2019-03-01 MED ORDER — ONDANSETRON HCL 4 MG/2ML IJ SOLN
4.0000 mg | Freq: Once | INTRAMUSCULAR | Status: AC
  Administered 2019-03-01: 4 mg via INTRAVENOUS
  Filled 2019-03-01: qty 2

## 2019-03-01 MED ORDER — PROMETHAZINE HCL 25 MG PO TABS: 25.0000 mg | ORAL_TABLET | ORAL | 1 refills | Status: AC | PRN

## 2019-03-01 MED ORDER — MORPHINE SULFATE (PF) 4 MG/ML IV SOLN
4.0000 mg | Freq: Once | INTRAVENOUS | Status: AC
  Administered 2019-03-01: 4 mg via INTRAVENOUS
  Filled 2019-03-01: qty 1

## 2019-03-01 MED ORDER — SODIUM CHLORIDE 0.9 % IV SOLN
Freq: Once | INTRAVENOUS | Status: AC
  Administered 2019-03-01: 11:00:00 via INTRAVENOUS

## 2019-03-01 MED ORDER — PROMETHAZINE HCL 25 MG/ML IJ SOLN
25.0000 mg | Freq: Once | INTRAMUSCULAR | Status: AC
  Administered 2019-03-01: 25 mg via INTRAVENOUS
  Filled 2019-03-01: qty 1

## 2019-03-01 NOTE — ED Notes (Signed)
Pt aware of need for urine sample.  

## 2019-03-01 NOTE — ED Notes (Signed)
Pt states he has had 2 episodes of vomiting in the last 24 hours and 5-6 episodes of diarrhea since Friday

## 2019-03-01 NOTE — ED Notes (Signed)
Pt desat'ed to 87% on RA - encouraged deep breathing with improvement to 92% but when not deep breathing desat'ed again - Dr Mayford Knife notified and pt placed on O2 via n/c at 2L which improved O2 sat to 96%

## 2019-03-01 NOTE — ED Triage Notes (Signed)
Pt states he was seen here on 4/28 with fever , cough and tested negative for covid, states since Friday he has had abd pain with N/V/D , denies any fever mild cough now. Pt is in NAD on arrival

## 2019-03-01 NOTE — ED Provider Notes (Signed)
[facility] Emergency Department Provider Note       Time seen: ----------------------------------------- 10:51 AM on 03/01/2019 -----------------------------------------   I have reviewed the triage vital signs and the nursing notes.  HISTORY   Chief Complaint Abdominal Pain    HPI Dewitt HoesJustin Webb is a 33 y.o. male with a history of hypertension who presents to the ED for abdominal pain with nausea, vomiting and diarrhea.  He denies any fever he does have a mild cough.  He tested negative for COVID a week ago.  He denies any other complaints at this time.  Pain is 7 out of 10 on arrival.  Past Medical History:  Diagnosis Date  . Hypertension     There are no active problems to display for this patient.   Past Surgical History:  Procedure Laterality Date  . BACK SURGERY      Allergies Penicillins  Social History Social History   Tobacco Use  . Smoking status: Never Smoker  . Smokeless tobacco: Never Used  Substance Use Topics  . Alcohol use: No  . Drug use: No   Review of Systems Constitutional: Negative for fever. Cardiovascular: Negative for chest pain. Respiratory: Positive for cough Gastrointestinal: Positive for abdominal pain with vomiting and diarrhea Musculoskeletal: Negative for back pain. Skin: Negative for rash. Neurological: Negative for headaches, focal weakness or numbness.  All systems negative/normal/unremarkable except as stated in the HPI  ____________________________________________   PHYSICAL EXAM:  VITAL SIGNS: ED Triage Vitals  Enc Vitals Group     BP 03/01/19 1028 (!) 146/89     Pulse Rate 03/01/19 1028 83     Resp 03/01/19 1028 18     Temp 03/01/19 1028 97.7 F (36.5 C)     Temp Source 03/01/19 1028 Oral     SpO2 03/01/19 1028 95 %     Weight 03/01/19 1029 (!) 385 lb (174.6 kg)     Height 03/01/19 1029 5\' 11"  (1.803 m)     Head Circumference --      Peak Flow --      Pain Score 03/01/19 1028 7      Pain Loc --      Pain Edu? --      Excl. in GC? --    Constitutional: Alert and oriented.  Obese, in no distress Eyes: Conjunctivae are normal. Normal extraocular movements. ENT      Head: Normocephalic and atraumatic.      Nose: No congestion/rhinnorhea.      Mouth/Throat: Mucous membranes are moist.      Neck: No stridor. Cardiovascular: Normal rate, regular rhythm. No murmurs, rubs, or gallops. Respiratory: Normal respiratory effort without tachypnea nor retractions. Breath sounds are clear and equal bilaterally. No wheezes/rales/rhonchi. Gastrointestinal: Nonfocal upper abdominal tenderness, no rebound or guarding.  Normal bowel sounds. Musculoskeletal: Nontender with normal range of motion in extremities. No lower extremity tenderness nor edema. Neurologic:  Normal speech and language. No gross focal neurologic deficits are appreciated.  Skin:  Skin is warm, dry and intact. No rash noted. Psychiatric: Mood and affect are normal. Speech and behavior are normal.  ____________________________________________  ED COURSE:  As part of my medical decision making, I reviewed the following data within the electronic MEDICAL RECORD NUMBER History obtained from family if available, nursing notes, old chart and ekg, as well as notes from prior ED visits. Patient presented for abdominal pain with vomiting and diarrhea, we will assess with labs and imaging as indicated at this time.  Procedures  Chaplin Cherenfant was evaluated in Emergency Department on 03/01/2019 for the symptoms described in the history of present illness. He was evaluated in the context of the global COVID-19 pandemic, which necessitated consideration that the patient might be at risk for infection with the SARS-CoV-2 virus that causes COVID-19. Institutional protocols and algorithms that pertain to the evaluation of patients at risk for COVID-19 are in a state of rapid change based on information released by regulatory bodies including the  CDC and federal and state organizations. These policies and algorithms were followed during the patient's care in the ED.  ____________________________________________   LABS (pertinent positives/negatives)  Labs Reviewed  COMPREHENSIVE METABOLIC PANEL - Abnormal; Notable for the following components:      Result Value   Glucose, Bld 159 (*)    ALT 49 (*)    All other components within normal limits  URINALYSIS, COMPLETE (UACMP) WITH MICROSCOPIC - Abnormal; Notable for the following components:   Color, Urine YELLOW (*)    APPearance CLEAR (*)    All other components within normal limits  SARS CORONAVIRUS 2 (HOSPITAL ORDER, PERFORMED IN [facility])  CBC WITH DIFFERENTIAL/PLATELET  LIPASE, BLOOD  ___________________________________________   DIFFERENTIAL DIAGNOSIS   Gastroenteritis, coronavirus, dehydration, electrolyte abnormality  FINAL ASSESSMENT AND PLAN  Abdominal pain, vomiting and diarrhea   Plan: The patient had presented for symptoms of gastroenteritis. Patient's labs do not reveal any acute process and he was negative second time for coronavirus.  Currently nausea appears to be improved.  He is discharged with antiemetics and outpatient follow-up.   Ulice Dash, MD    Note: This note was generated in part or whole with voice recognition software. Voice recognition is usually quite accurate but there are transcription errors that can and very often do occur. I apologize for any typographical errors that were not detected and corrected.     Emily Filbert, MD 03/01/19 [id number]

## 2019-03-09 ENCOUNTER — Other Ambulatory Visit: Payer: Self-pay

## 2019-03-09 ENCOUNTER — Emergency Department
Admission: EM | Admit: 2019-03-09 | Discharge: 2019-03-09 | Disposition: A | Discharge status: Home / Self Care | Payer: Medicaid Other | Attending: Emergency Medicine | Admitting: Emergency Medicine

## 2019-03-09 ENCOUNTER — Encounter: Payer: Self-pay | Admitting: Emergency Medicine

## 2019-03-09 DIAGNOSIS — Z79899 Other long term (current) drug therapy: Secondary | ICD-10-CM | POA: Insufficient documentation

## 2019-03-09 DIAGNOSIS — R110 Nausea: Secondary | ICD-10-CM

## 2019-03-09 DIAGNOSIS — I10 Essential (primary) hypertension: Secondary | ICD-10-CM | POA: Insufficient documentation

## 2019-03-09 DIAGNOSIS — R531 Weakness: Secondary | ICD-10-CM

## 2019-03-09 LAB — COMPREHENSIVE METABOLIC PANEL
ALT: 52 U/L — ABNORMAL HIGH (ref 0–44)
AST: 38 U/L (ref 15–41)
Albumin: 4 g/dL (ref 3.5–5.0)
Alkaline Phosphatase: 112 U/L (ref 38–126)
Anion gap: 9 (ref 5–15)
BUN: 15 mg/dL (ref 6–20)
CO2: 29 mmol/L (ref 22–32)
Calcium: 9.4 mg/dL (ref 8.9–10.3)
Chloride: 100 mmol/L (ref 98–111)
Creatinine, Ser: 0.76 mg/dL (ref 0.61–1.24)
GFR calc Af Amer: >60 mL/min (ref >60)
GFR calc non Af Amer: >60 mL/min (ref >60)
Glucose, Bld: 191 mg/dL — ABNORMAL HIGH (ref 70–99)
Potassium: 4.8 mmol/L (ref 3.5–5.1)
Sodium: 138 mmol/L (ref 135–145)
Total Bilirubin: 0.6 mg/dL (ref 0.3–1.2)
Total Protein: 8 g/dL (ref 6.5–8.1)

## 2019-03-09 LAB — URINALYSIS, COMPLETE (UACMP) WITH MICROSCOPIC
Bacteria, UA: NONE SEEN
Bilirubin Urine: NEGATIVE
Glucose, UA: NEGATIVE mg/dL
Hgb urine dipstick: NEGATIVE
Ketones, ur: NEGATIVE mg/dL
Leukocytes,Ua: NEGATIVE
Nitrite: NEGATIVE
Protein, ur: 30 mg/dL — AB
Specific Gravity, Urine: 1.024 (ref 1.005–1.030)
Squamous Epithelial / HPF: NONE SEEN (ref 0–5)
pH: 5 (ref 5.0–8.0)

## 2019-03-09 LAB — CBC
HCT: 48.5 % (ref 39.0–52.0)
Hemoglobin: 15.7 g/dL (ref 13.0–17.0)
MCH: 29.7 pg (ref 26.0–34.0)
MCHC: 32.4 g/dL (ref 30.0–36.0)
MCV: 91.9 fL (ref 80.0–100.0)
Platelets: 274 10*3/uL (ref 150–400)
RBC: 5.28 MIL/uL (ref 4.22–5.81)
RDW: 13.5 % (ref 11.5–15.5)
WBC: 9.5 10*3/uL (ref 4.0–10.5)
nRBC: 0 % (ref 0.0–0.2)

## 2019-03-09 LAB — LIPASE, BLOOD: Lipase: 27 U/L (ref 11–51)

## 2019-03-09 LAB — TROPONIN I: Troponin I: <0.03 ng/mL (ref <0.03)

## 2019-03-09 MED ORDER — ONDANSETRON 4 MG PO TBDP: 4.0000 mg | ORAL_TABLET | Freq: Three times a day (TID) | ORAL | 0 refills | Status: AC | PRN

## 2019-03-09 NOTE — ED Notes (Signed)
Awaiting urine specimen. Patient vss, labs resulted. Awaiting further plan of care.

## 2019-03-09 NOTE — ED Triage Notes (Signed)
Pt here with c/o cough, nausea, vomiting and diarrhea that started last week, was seen here and Covid tested negative, started getting better then it all came back. States he also can be driving or standing and just "falls asleep," this is new for him, he doesn't feel overly fatigued and is unsure what is causing this. Denies fever.

## 2019-03-09 NOTE — ED Notes (Signed)
Patient reports feeling nausea, diarrhea x 1 week. Also reports cough x few days. Labs drawn and sent.

## 2019-03-09 NOTE — ED Triage Notes (Signed)
Sore throat is a new symptom that began over the weekend.

## 2019-03-09 NOTE — ED Provider Notes (Signed)
[facility] Emergency Department Provider Note  Time seen: 11:42 AM  I have reviewed the triage vital signs and the nursing notes.   HISTORY  Chief Complaint Cough; Nausea; Diarrhea; and Sore Throat    HPI Ricky Webb is a 33 y.o. male with a past medical history of hypertension, obesity, presents to the emergency department for very symptoms.  According to the patient he was seen for similar symptoms 1 week ago, states he felt like he was getting better however over the past 2 days he has once again become nauseated had one episode of vomiting, has had intermittent loose stool as well as an intermittent cough.  Patient denies any known fever.  Patient was seen in the emergency department 1 week ago for similar symptoms with an overall negative work-up including a negative corona test.  Patient's vitals are reassuring currently with a room air saturation in the upper 90s, afebrile.  Patient does not know of any sick contacts, states he has been maintaining good isolation.   Past Medical History:  Diagnosis Date  . Hypertension     There are no active problems to display for this patient.   Past Surgical History:  Procedure Laterality Date  . BACK SURGERY      Prior to Admission medications   Medication Sig Start Date End Date Taking? Authorizing Provider  amLODipine (NORVASC) 5 MG tablet Take 5 mg by mouth daily. 12/28/18   [provider]  atorvastatin (LIPITOR) 40 MG tablet Take 40 mg by mouth daily. 12/29/18   [provider]  butalbital-acetaminophen-caffeine (FIORICET) 50-325-40 MG tablet Take 1-2 tablets by mouth every 6 (six) hours as needed. 02/23/19 02/23/20  Emily Filbert, MD  chlorpheniramine-HYDROcodone (TUSSIONEX PENNKINETIC ER) 10-8 MG/5ML SUER Take 5 mLs by mouth 2 (two) times daily. 02/23/19   Emily Filbert, MD  indomethacin (INDOCIN) 50 MG capsule Take 50 mg by mouth 3 (three) times daily. 12/28/18   [provider]  meloxicam (MOBIC) 15 MG tablet Take 1 tablet (15 mg total) by mouth daily. 12/21/18 12/21/19  Fisher, Roselyn Bering, PA-C  promethazine (PHENERGAN) 25 MG tablet Take 1 tablet (25 mg total) by mouth every 4 (four) hours as needed for nausea or vomiting. 03/01/19   Emily Filbert, MD    Allergies  Allergen Reactions  . Penicillins Nausea And Vomiting    No family history on file.  Social History Social History   Tobacco Use  . Smoking status: Never Smoker  . Smokeless tobacco: Never Used  Substance Use Topics  . Alcohol use: No  . Drug use: No    Review of Systems Constitutional: Negative for fever.  Positive for fatigue. ENT: States mild sore throat. Cardiovascular: Negative for chest pain. Respiratory: Negative for shortness of breath.  Positive for cough. Gastrointestinal: Negative for abdominal pain.  Positive for nausea, 1 episode of vomiting.  Positive for intermittent loose stool. Genitourinary: Negative for urinary compaints Musculoskeletal: Negative for musculoskeletal complaints Skin: Negative for skin complaints  Neurological: Negative for headache All other ROS negative  ____________________________________________   PHYSICAL EXAM:  VITAL SIGNS: ED Triage Vitals  Enc Vitals Group     BP 03/09/19 1117 129/71     Pulse Rate 03/09/19 1117 86     Resp 03/09/19 1117 20     Temp 03/09/19 1117 97.7 F (36.5 C)     Temp Source 03/09/19 1117 Oral     SpO2 03/09/19 1117 96 %  Weight 03/09/19 1118 (!) 395 lb (179.2 kg)     Height 03/09/19 1118 5\' 11"  (1.803 m)     Head Circumference --      Peak Flow --      Pain Score 03/09/19 1118 5     Pain Loc --      Pain Edu? --      Excl. in GC? --    Constitutional: Alert and oriented. Well appearing and in no distress. Eyes: Normal exam ENT      Head: Normocephalic and atraumatic      Mouth/Throat: Mucous membranes are moist.  Normal oropharynx without pharyngeal erythema or  exudates. Cardiovascular: Normal rate, regular rhythm.  Respiratory: Normal respiratory effort without tachypnea nor retractions. Breath sounds are clear  Gastrointestinal: Soft, nontender.  No distention.  Obese. Musculoskeletal: Nontender with normal range of motion in all extremities. Neurologic:  Normal speech and language. No gross focal neurologic deficit Skin:  Skin is warm, dry and intact.   Psychiatric: Mood and affect are normal.   INITIAL IMPRESSION / ASSESSMENT AND PLAN / ED COURSE  Pertinent labs & imaging results that were available during my care of the patient were reviewed by me and considered in my medical decision making (see chart for details).   Patient presents to the emergency department for symptoms of intermittent cough, fatigue, nausea, one episode of vomiting, intermittent loose stool over the past 1 week.  Patient states he thought he was getting better however over the past 2 days his symptoms have once again recurred.  Patient denies any abdominal pain.  Denies any chest pain.  Denies any known fever.  Patient's work-up from 8 days ago was largely nonrevealing including a negative corona test.  We will repeat lab work today and continue to closely monitor.  Differential at this time would include viral illness, infectious etiology, metabolic or electrolyte abnormality, anemia.  Given a negative corona test 8 days ago with no fever we will hold off on further COVID testing.  Patient's work-up is negative.  Urinalysis negative.  Overall the patient appears well reassuring labs are reassuring vitals.  I discussed supportive care at home, rest and fluids with PCP follow-up.  Patient agreeable to plan of care.  Ricky Webb was evaluated in Emergency Department on 03/09/2019 for the symptoms described in the history of present illness. He was evaluated in the context of the global COVID-19 pandemic, which necessitated consideration that the patient might be at risk for  infection with the SARS-CoV-2 virus that causes COVID-19. Institutional protocols and algorithms that pertain to the evaluation of patients at risk for COVID-19 are in a state of rapid change based on information released by regulatory bodies including the CDC and federal and state organizations. These policies and algorithms were followed during the patient's care in the ED.  ____________________________________________   FINAL CLINICAL IMPRESSION(S) / ED DIAGNOSES  Weakness Nausea   Minna AntisPaduchowski, Pierson Vantol, MD 03/09/19 1512

## 2019-03-28 ENCOUNTER — Emergency Department (HOSPITAL_COMMUNITY): Payer: Medicaid Other

## 2019-03-28 ENCOUNTER — Emergency Department (HOSPITAL_COMMUNITY)
Admission: EM | Admit: 2019-03-28 | Discharge: 2019-03-28 | Disposition: A | Discharge status: Home / Self Care | Payer: Medicaid Other | Attending: Emergency Medicine | Admitting: Emergency Medicine

## 2019-03-28 ENCOUNTER — Encounter (HOSPITAL_COMMUNITY): Payer: Self-pay

## 2019-03-28 ENCOUNTER — Other Ambulatory Visit: Payer: Self-pay

## 2019-03-28 DIAGNOSIS — S40011A Contusion of right shoulder, initial encounter: Secondary | ICD-10-CM

## 2019-03-28 DIAGNOSIS — Y999 Unspecified external cause status: Secondary | ICD-10-CM | POA: Insufficient documentation

## 2019-03-28 DIAGNOSIS — I10 Essential (primary) hypertension: Secondary | ICD-10-CM | POA: Insufficient documentation

## 2019-03-28 DIAGNOSIS — Y9389 Activity, other specified: Secondary | ICD-10-CM | POA: Insufficient documentation

## 2019-03-28 DIAGNOSIS — R079 Chest pain, unspecified: Secondary | ICD-10-CM | POA: Insufficient documentation

## 2019-03-28 DIAGNOSIS — Y929 Unspecified place or not applicable: Secondary | ICD-10-CM | POA: Insufficient documentation

## 2019-03-28 DIAGNOSIS — R109 Unspecified abdominal pain: Secondary | ICD-10-CM | POA: Insufficient documentation

## 2019-03-28 DIAGNOSIS — Z79899 Other long term (current) drug therapy: Secondary | ICD-10-CM | POA: Insufficient documentation

## 2019-03-28 DIAGNOSIS — G43909 Migraine, unspecified, not intractable, without status migrainosus: Secondary | ICD-10-CM | POA: Insufficient documentation

## 2019-03-28 DIAGNOSIS — T162XXA Foreign body in left ear, initial encounter: Secondary | ICD-10-CM

## 2019-03-28 HISTORY — DX: Hyperlipidemia, unspecified: E78.5

## 2019-03-28 HISTORY — DX: Migraine, unspecified, not intractable, without status migrainosus: G43.909

## 2019-03-28 HISTORY — DX: Sleep apnea, unspecified: G47.30

## 2019-03-28 LAB — CBC WITH DIFFERENTIAL/PLATELET
Abs Immature Granulocytes: 0.22 10*3/uL — ABNORMAL HIGH (ref 0.00–0.07)
Basophils Absolute: 0.1 10*3/uL (ref 0.0–0.1)
Basophils Relative: 0 %
Eosinophils Absolute: 0.3 10*3/uL (ref 0.0–0.5)
Eosinophils Relative: 1 %
HCT: 45.7 % (ref 39.0–52.0)
Hemoglobin: 15.1 g/dL (ref 13.0–17.0)
Immature Granulocytes: 1 %
Lymphocytes Relative: 11 %
Lymphs Abs: 2 10*3/uL (ref 0.7–4.0)
MCH: 30.1 pg (ref 26.0–34.0)
MCHC: 33 g/dL (ref 30.0–36.0)
MCV: 91.2 fL (ref 80.0–100.0)
Monocytes Absolute: 1.1 10*3/uL — ABNORMAL HIGH (ref 0.1–1.0)
Monocytes Relative: 6 %
Neutro Abs: 15 10*3/uL — ABNORMAL HIGH (ref 1.7–7.7)
Neutrophils Relative %: 81 %
Platelets: 361 10*3/uL (ref 150–400)
RBC: 5.01 MIL/uL (ref 4.22–5.81)
RDW: 13.7 % (ref 11.5–15.5)
WBC: 18.6 10*3/uL — ABNORMAL HIGH (ref 4.0–10.5)
nRBC: 0 % (ref 0.0–0.2)

## 2019-03-28 LAB — PROTIME-INR
INR: 0.9 (ref 0.8–1.2)
Prothrombin Time: 12.2 seconds (ref 11.4–15.2)

## 2019-03-28 LAB — TYPE AND SCREEN
ABO/RH(D): O POS
Antibody Screen: NEGATIVE

## 2019-03-28 LAB — COMPREHENSIVE METABOLIC PANEL
ALT: 57 U/L — ABNORMAL HIGH (ref 0–44)
AST: 49 U/L — ABNORMAL HIGH (ref 15–41)
Albumin: 4.1 g/dL (ref 3.5–5.0)
Alkaline Phosphatase: 101 U/L (ref 38–126)
Anion gap: 15 (ref 5–15)
BUN: 13 mg/dL (ref 6–20)
CO2: 23 mmol/L (ref 22–32)
Calcium: 9.5 mg/dL (ref 8.9–10.3)
Chloride: 101 mmol/L (ref 98–111)
Creatinine, Ser: 1.37 mg/dL — ABNORMAL HIGH (ref 0.61–1.24)
GFR calc Af Amer: >60 mL/min (ref >60)
GFR calc non Af Amer: >60 mL/min (ref >60)
Glucose, Bld: 197 mg/dL — ABNORMAL HIGH (ref 70–99)
Potassium: 3.7 mmol/L (ref 3.5–5.1)
Sodium: 139 mmol/L (ref 135–145)
Total Bilirubin: 0.5 mg/dL (ref 0.3–1.2)
Total Protein: 7.8 g/dL (ref 6.5–8.1)

## 2019-03-28 LAB — ETHANOL: Alcohol, Ethyl (B): <10 mg/dL (ref <10)

## 2019-03-28 MED ORDER — NAPROXEN 500 MG PO TABS: 500.0000 mg | ORAL_TABLET | Freq: Two times a day (BID) | ORAL | 0 refills | Status: AC

## 2019-03-28 MED ORDER — TETANUS-DIPHTH-ACELL PERTUSSIS 5-2.5-18.5 LF-MCG/0.5 IM SUSP
0.5000 mL | Freq: Once | INTRAMUSCULAR | Status: AC
  Administered 2019-03-28: 0.5 mL via INTRAMUSCULAR
  Filled 2019-03-28: qty 0.5

## 2019-03-28 MED ORDER — IOHEXOL 300 MG/ML  SOLN
100.0000 mL | Freq: Once | INTRAMUSCULAR | Status: AC | PRN
  Administered 2019-03-28: 100 mL via INTRAVENOUS
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
## 2019-03-28 NOTE — Discharge Instructions (Signed)
Your x-ray showed no signs of broken bones though it does show that you have a couple of old rib fractures which are healed You have fatty infiltration of your liver which is an abnormal but expected finding for obesity, please let your doctor know about this You do not have any signs of broken bones or internal injury  Please make sure that you take an anti-inflammatory such as naproxen 500 mg twice daily or ibuprofen as needed for pain, do not take more than is indicated on the bottle.  Seek medical exam for severe or worsening symptoms and please talk to your family doctor about your sleep apnea

## 2019-03-28 NOTE — ED Notes (Addendum)
Pain noted in right shoulder and upper back and nekc EDP removed piece of glass from left ear

## 2019-07-22 ENCOUNTER — Other Ambulatory Visit: Payer: Self-pay

## 2019-07-22 ENCOUNTER — Emergency Department (HOSPITAL_COMMUNITY)
Admission: EM | Admit: 2019-07-22 | Discharge: 2019-07-23 | Disposition: A | Discharge status: Home / Self Care | Payer: Medicaid Other | Attending: Emergency Medicine | Admitting: Emergency Medicine

## 2019-07-22 ENCOUNTER — Encounter (HOSPITAL_COMMUNITY): Payer: Self-pay | Admitting: Emergency Medicine

## 2019-07-22 DIAGNOSIS — R739 Hyperglycemia, unspecified: Secondary | ICD-10-CM

## 2019-07-22 DIAGNOSIS — E1165 Type 2 diabetes mellitus with hyperglycemia: Secondary | ICD-10-CM | POA: Insufficient documentation

## 2019-07-22 DIAGNOSIS — Z79899 Other long term (current) drug therapy: Secondary | ICD-10-CM | POA: Insufficient documentation

## 2019-07-22 DIAGNOSIS — I10 Essential (primary) hypertension: Secondary | ICD-10-CM | POA: Insufficient documentation

## 2019-07-22 HISTORY — DX: Type 2 diabetes mellitus without complications: E11.9

## 2019-07-22 LAB — CBC
HCT: 49.7 % (ref 39.0–52.0)
Hemoglobin: 16.3 g/dL (ref 13.0–17.0)
MCH: 30 pg (ref 26.0–34.0)
MCHC: 32.8 g/dL (ref 30.0–36.0)
MCV: 91.4 fL (ref 80.0–100.0)
Platelets: 251 10*3/uL (ref 150–400)
RBC: 5.44 MIL/uL (ref 4.22–5.81)
RDW: 13.7 % (ref 11.5–15.5)
WBC: 7.8 10*3/uL (ref 4.0–10.5)
nRBC: 0 % (ref 0.0–0.2)

## 2019-07-22 LAB — BASIC METABOLIC PANEL
Anion gap: 12 (ref 5–15)
BUN: 15 mg/dL (ref 6–20)
CO2: 27 mmol/L (ref 22–32)
Calcium: 9.6 mg/dL (ref 8.9–10.3)
Chloride: 93 mmol/L — ABNORMAL LOW (ref 98–111)
Creatinine, Ser: 0.98 mg/dL (ref 0.61–1.24)
GFR calc Af Amer: >60 mL/min (ref >60)
GFR calc non Af Amer: >60 mL/min (ref >60)
Glucose, Bld: 405 mg/dL — ABNORMAL HIGH (ref 70–99)
Potassium: 4.1 mmol/L (ref 3.5–5.1)
Sodium: 132 mmol/L — ABNORMAL LOW (ref 135–145)

## 2019-07-22 LAB — CBG MONITORING, ED: Glucose-Capillary: 450 mg/dL — ABNORMAL HIGH (ref 70–99)

## 2019-07-22 NOTE — ED Triage Notes (Signed)
Pt arrives to ED c/o hyperglycemia. Pt started taking metformin yesterday, 500 mg TID. Last CBG reading was 410.

## 2019-07-22 NOTE — ED Notes (Signed)
CBG 450 in triage.

## 2019-07-23 MED ORDER — INSULIN ASPART 100 UNIT/ML ~~LOC~~ SOLN
5.0000 [IU] | Freq: Once | SUBCUTANEOUS | Status: AC
  Administered 2019-07-23: 5 [IU] via SUBCUTANEOUS
  Filled 2019-07-23: qty 1

## 2019-07-23 NOTE — ED Provider Notes (Signed)
[facility] EMERGENCY DEPARTMENT Provider Note   CSN: [id number] Arrival date & time: 07/22/19  2040   Time seen 12:00 midnight  History   Chief Complaint Chief Complaint  Patient presents with  . Hyperglycemia    HPI Ricky Webb is a 33 y.o. male.     HPI patient states he was seen by his primary care doctor 2 weeks ago and had blood work done on September 14.  He states at that time he was having blurred vision, a brown rash on his skin, nausea, feeling tired all the time, polydipsia and polyuria with nocturia about every hour that had been going on for about 1 month.  He denies any weight loss.  He states he called to try to find out his test results and he received a text from the [facility] pharmacy on Monday, September 21 that he had a prescription for insulin.  He states he called them and told them he was not going to take it because he could not afford it.  He then received a text on the 22nd that he had a prescription for metformin.  He states he picked up the metformin and started it last night.  He states last night his blood sugar was 590, this morning it was 365, and this evening it was 410.  He states he called the emergency line at his doctor's office and he was told to come to the ED.  He denies chest pain, shortness of breath, he does note some mild abdominal discomfort since starting the metformin.  He states there is no family history of diabetes.  PCP Evelene Croon, MD   Past Medical History:  Diagnosis Date  . Diabetes mellitus without complication (HCC)   . Hyperlipidemia   . Hypertension   . Migraines   . Sleep apnea     There are no active problems to display for this patient.   Past Surgical History:  Procedure Laterality Date  . BACK SURGERY          Home Medications    Prior to Admission medications   Medication Sig Start Date End Date Taking? Authorizing Provider  amLODipine (NORVASC) 5 MG tablet Take 5 mg by mouth daily. 12/28/18    [provider]  atorvastatin (LIPITOR) 40 MG tablet Take 40 mg by mouth daily. 12/29/18   [provider]  butalbital-acetaminophen-caffeine (FIORICET) 50-325-40 MG tablet Take 1-2 tablets by mouth every 6 (six) hours as needed. 02/23/19 02/23/20  Emily Filbert, MD  chlorpheniramine-HYDROcodone (TUSSIONEX PENNKINETIC ER) 10-8 MG/5ML SUER Take 5 mLs by mouth 2 (two) times daily. 02/23/19   Emily Filbert, MD  indomethacin (INDOCIN) 50 MG capsule Take 50 mg by mouth 3 (three) times daily. 12/28/18   [provider]  meloxicam (MOBIC) 15 MG tablet Take 1 tablet (15 mg total) by mouth daily. 12/21/18 12/21/19  Sherrie Mustache, Roselyn Bering, PA-C  naproxen (NAPROSYN) 500 MG tablet Take 1 tablet (500 mg total) by mouth 2 (two) times daily with a meal. 03/28/19   Eber Hong, MD  ondansetron (ZOFRAN ODT) 4 MG disintegrating tablet Take 1 tablet (4 mg total) by mouth every 8 (eight) hours as needed for nausea or vomiting. 03/09/19   Minna Antis, MD  promethazine (PHENERGAN) 25 MG tablet Take 1 tablet (25 mg total) by mouth every 4 (four) hours as needed for nausea or vomiting. 03/01/19   Emily Filbert, MD    Family History No family history on file.  Social History  Social History   Tobacco Use  . Smoking status: Never Smoker  . Smokeless tobacco: Never Used  Substance Use Topics  . Alcohol use: No  . Drug use: No     Allergies   Penicillins   Review of Systems Review of Systems  All other systems reviewed and are negative.    Physical Exam Updated Vital Signs BP (!) 157/88 (BP Location: Right Arm)   Pulse 91   Temp 98 F (36.7 C) (Oral)   Resp 19   Ht 6' (1.829 m)   Wt (!) 179.2 kg   SpO2 95%   BMI 53.57 kg/m   Physical Exam Vitals signs and nursing note reviewed.  Constitutional:      General: He is not in acute distress.    Appearance: Normal appearance. He is well-developed. He is obese. He is not ill-appearing or toxic-appearing.  HENT:      Head: Normocephalic and atraumatic.     Right Ear: External ear normal.     Left Ear: External ear normal.     Nose: Nose normal. No mucosal edema or rhinorrhea.     Mouth/Throat:     Mouth: Mucous membranes are dry.     Dentition: No dental abscesses.     Pharynx: Oropharynx is clear. No oropharyngeal exudate, posterior oropharyngeal erythema or uvula swelling.  Eyes:     Extraocular Movements: Extraocular movements intact.     Conjunctiva/sclera: Conjunctivae normal.     Pupils: Pupils are equal, round, and reactive to light.  Neck:     Musculoskeletal: Full passive range of motion without pain, normal range of motion and neck supple.  Cardiovascular:     Rate and Rhythm: Normal rate and regular rhythm.     Heart sounds: Normal heart sounds. No murmur. No friction rub. No gallop.   Pulmonary:     Effort: Pulmonary effort is normal. No respiratory distress.     Breath sounds: Normal breath sounds. No wheezing, rhonchi or rales.  Chest:     Chest wall: No tenderness or crepitus.  Abdominal:     General: Bowel sounds are normal. There is no distension.     Palpations: Abdomen is soft.     Tenderness: There is no abdominal tenderness. There is no guarding or rebound.  Musculoskeletal: Normal range of motion.        General: No tenderness.     Comments: Moves all extremities well.   Skin:    General: Skin is warm and dry.     Coloration: Skin is not pale.     Findings: No erythema or rash.  Neurological:     General: No focal deficit present.     Mental Status: He is alert and oriented to person, place, and time.     Cranial Nerves: No cranial nerve deficit.  Psychiatric:        Mood and Affect: Mood normal. Mood is not anxious.        Speech: Speech normal.        Behavior: Behavior normal.        Thought Content: Thought content normal.      ED Treatments / Results  Labs (all labs ordered are listed, but only abnormal results are displayed) Results for orders  placed or performed during the hospital encounter of 07/22/19  Basic metabolic panel  Result Value Ref Range   Sodium 132 (L) 135 - 145 mmol/L   Potassium 4.1 3.5 - 5.1 mmol/L   Chloride 93 (L)  98 - 111 mmol/L   CO2 27 22 - 32 mmol/L   Glucose, Bld 405 (H) 70 - 99 mg/dL   BUN 15 6 - 20 mg/dL   Creatinine, Ser 0.98 0.61 - 1.24 mg/dL   Calcium 9.6 8.9 - 10.3 mg/dL   GFR calc non Af Amer >60 >60 mL/min   GFR calc Af Amer >60 >60 mL/min   Anion gap 12 5 - 15  CBC  Result Value Ref Range   WBC 7.8 4.0 - 10.5 K/uL   RBC 5.44 4.22 - 5.81 MIL/uL   Hemoglobin 16.3 13.0 - 17.0 g/dL   HCT 49.7 39.0 - 52.0 %   MCV 91.4 80.0 - 100.0 fL   MCH 30.0 26.0 - 34.0 pg   MCHC 32.8 30.0 - 36.0 g/dL   RDW 13.7 11.5 - 15.5 %   Platelets 251 150 - 400 K/uL   nRBC 0.0 0.0 - 0.2 %  CBG monitoring, ED  Result Value Ref Range   Glucose-Capillary 450 (H) 70 - 99 mg/dL   Laboratory interpretation all normal except hyperglycemia without metabolic acidosis    EKG None  Radiology No results found.  Procedures Procedures (including critical care time)  Medications Ordered in ED Medications  insulin aspart (novoLOG) injection 5 Units (5 Units Subcutaneous Given 07/23/19 0025)     Initial Impression / Assessment and Plan / ED Course  I have reviewed the triage vital signs and the nursing notes.  Pertinent labs & imaging results that were available during my care of the patient were reviewed by me and considered in my medical decision making (see chart for details).        Patient is concerned because he has had no diabetic teaching.  I will give him information about diabetic diet.  He was given low-dose insulin subcu here to help bring his blood sugar down however we also discussed that he should not try to be bringing his blood sugar down rapidly.  That the metformin will work but it will take it a few more days to get things more stabilized.  Patient was just started on metformin 24 hours ago  and his blood sugars are actually improving.  He has no acute worsening symptoms tonight.  He was discharged home to continue the metformin and start following a diabetic diet.  He needs to contact his doctor's office to get more diabetic teaching.  Final Clinical Impressions(s) / ED Diagnoses   Final diagnoses:  Hyperglycemia    ED Discharge Orders    None     Plan discharge  Rolland Porter, MD, Barbette Or, MD 07/23/19 [phone]

## 2019-07-23 NOTE — Discharge Instructions (Addendum)
Please continue your metformin.  Look at the diabetic diet information and try to start following that.  You should notice that your blood sugar is slowly coming down with the medication and following the diet.  You should be rechecked if you get chest pain, shortness of breath, have uncontrolled vomiting or you feel worse.  Please contact your doctor's office to get a appointment with a dietitian to help you with your new diabetic diet and to get diabetic teaching.

## 2019-08-29 DEATH — deceased
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
